# Patient Record
Sex: Male | Born: 1965 | Race: White | Hispanic: No | Marital: Single | Smoking: Never smoker
Health system: Southern US, Community
[De-identification: ages and names within clinical notes are randomized; demographics above are authoritative.]

## PROBLEM LIST (undated history)

## (undated) DIAGNOSIS — M25562 Pain in left knee: Principal | ICD-10-CM

## (undated) DIAGNOSIS — K625 Hemorrhage of anus and rectum: Secondary | ICD-10-CM

## (undated) DIAGNOSIS — I10 Essential (primary) hypertension: Secondary | ICD-10-CM

## (undated) DIAGNOSIS — R7611 Nonspecific reaction to tuberculin skin test without active tuberculosis: Secondary | ICD-10-CM

## (undated) DIAGNOSIS — E559 Vitamin D deficiency, unspecified: Secondary | ICD-10-CM

## (undated) DIAGNOSIS — G8929 Other chronic pain: Secondary | ICD-10-CM

## (undated) DIAGNOSIS — K219 Gastro-esophageal reflux disease without esophagitis: Secondary | ICD-10-CM

## (undated) DIAGNOSIS — K2990 Gastroduodenitis, unspecified, without bleeding: Secondary | ICD-10-CM

## (undated) DIAGNOSIS — Z86010 Personal history of colonic polyps: Secondary | ICD-10-CM

## (undated) HISTORY — DX: Essential (primary) hypertension: I10

## (undated) HISTORY — DX: Pain in left knee: M25.562

## (undated) HISTORY — DX: Gastro-esophageal reflux disease without esophagitis: K21.9

## (undated) HISTORY — DX: Personal history of colonic polyps: Z86.010

## (undated) HISTORY — DX: Vitamin D deficiency, unspecified: E55.9

## (undated) HISTORY — DX: Gastroduodenitis, unspecified, without bleeding: K29.90

## (undated) HISTORY — DX: Other chronic pain: G89.29

## (undated) HISTORY — DX: Hemorrhage of anus and rectum: K62.5

## (undated) HISTORY — DX: Nonspecific reaction to tuberculin skin test without active tuberculosis: R76.11

## (undated) HISTORY — PX: TONSILLECTOMY: SHX5217

---

## 2014-04-29 DIAGNOSIS — Z86010 Personal history of colonic polyps: Secondary | ICD-10-CM

## 2014-04-29 HISTORY — PX: COLONOSCOPY W/ POLYPECTOMY: SHX1380

## 2014-04-29 HISTORY — PX: ESOPHAGOGASTRODUODENOSCOPY: SHX1529

## 2014-04-29 HISTORY — DX: Personal history of colonic polyps: Z86.010

## 2015-05-25 ENCOUNTER — Encounter: Payer: Self-pay | Admitting: Family Medicine

## 2015-05-26 ENCOUNTER — Encounter: Payer: Self-pay | Admitting: Family Medicine

## 2015-05-26 ENCOUNTER — Ambulatory Visit (INDEPENDENT_AMBULATORY_CARE_PROVIDER_SITE_OTHER): Payer: BLUE CROSS/BLUE SHIELD | Admitting: Family Medicine

## 2015-05-26 VITALS — BP 124/89 | HR 79 | Temp 97.5°F | Resp 16 | Ht 72.0 in | Wt 249.5 lb

## 2015-05-26 DIAGNOSIS — R0789 Other chest pain: Secondary | ICD-10-CM

## 2015-05-26 DIAGNOSIS — R109 Unspecified abdominal pain: Secondary | ICD-10-CM | POA: Diagnosis not present

## 2015-05-26 DIAGNOSIS — R0781 Pleurodynia: Secondary | ICD-10-CM | POA: Diagnosis not present

## 2015-05-26 DIAGNOSIS — R140 Abdominal distension (gaseous): Secondary | ICD-10-CM | POA: Diagnosis not present

## 2015-05-26 LAB — COMPREHENSIVE METABOLIC PANEL
ALBUMIN: 4.1 g/dL (ref 3.6–5.1)
ALT: 12 U/L (ref 9–46)
AST: 15 U/L (ref 10–40)
Alkaline Phosphatase: 45 U/L (ref 40–115)
BUN: 14 mg/dL (ref 7–25)
CALCIUM: 9.4 mg/dL (ref 8.6–10.3)
CHLORIDE: 103 mmol/L (ref 98–110)
CO2: 25 mmol/L (ref 20–31)
CREATININE: 1.04 mg/dL (ref 0.60–1.35)
Glucose, Bld: 86 mg/dL (ref 65–99)
POTASSIUM: 4.3 mmol/L (ref 3.5–5.3)
Sodium: 140 mmol/L (ref 135–146)
TOTAL PROTEIN: 7.5 g/dL (ref 6.1–8.1)
Total Bilirubin: 0.7 mg/dL (ref 0.2–1.2)

## 2015-05-26 LAB — CBC WITH DIFFERENTIAL/PLATELET
Basophils Absolute: 0.1 10*3/uL (ref 0.0–0.1)
Basophils Relative: 1 % (ref 0–1)
Eosinophils Absolute: 0.1 10*3/uL (ref 0.0–0.7)
Eosinophils Relative: 2 % (ref 0–5)
HEMATOCRIT: 45.9 % (ref 39.0–52.0)
HEMOGLOBIN: 15.5 g/dL (ref 13.0–17.0)
LYMPHS ABS: 2.8 10*3/uL (ref 0.7–4.0)
Lymphocytes Relative: 44 % (ref 12–46)
MCH: 28.7 pg (ref 26.0–34.0)
MCHC: 33.8 g/dL (ref 30.0–36.0)
MCV: 84.8 fL (ref 78.0–100.0)
MONO ABS: 0.4 10*3/uL (ref 0.1–1.0)
MONOS PCT: 6 % (ref 3–12)
MPV: 11.3 fL (ref 8.6–12.4)
NEUTROS ABS: 3 10*3/uL (ref 1.7–7.7)
NEUTROS PCT: 47 % (ref 43–77)
Platelets: 248 10*3/uL (ref 150–400)
RBC: 5.41 MIL/uL (ref 4.22–5.81)
RDW: 13.7 % (ref 11.5–15.5)
WBC: 6.4 10*3/uL (ref 4.0–10.5)

## 2015-05-26 NOTE — Addendum Note (Signed)
Addended by: Doree BarthelLOWE, CASANDRA on: 05/26/2015 03:14 PM   Modules accepted: Orders

## 2015-05-26 NOTE — Progress Notes (Signed)
Pre visit review using our clinic review tool, if applicable. No additional management support is needed unless otherwise documented below in the visit note. 

## 2015-05-26 NOTE — Progress Notes (Signed)
Office Note 05/26/2015  CC:  Chief Complaint  Patient presents with  . Establish Care    Pt is not fasting.   . Rib Pain    right side x 2-3 weeks, off and on    HPI:  Clinton Garcia is a 50 y.o. White male who is here to establish care and discuss pain on right side lately. Patient's most recent primary MD: [place]. Old records were reviewed prior to or during today's visit.  Has had about 3 wks or so of pain in R/lateral ribs area, feels like a pulled muscle, was the most sore after getting off flight from [place].  Rolling over during sleep aggravated it.  It has improved a little in the last week. Recalls no specific strain or incident in which he made his side hurt. Not made worse by deep breath.  No cough.  No SOB.  Initially eating made him feel bloated, but not now b/c he has changed his diet significantly last week or two.  No fevers.  The pain gets a little better if he has a BM.  Nothing noted on the skin over the area of pain.  Past Medical History  Diagnosis Date  . History of colon polyps 04/29/14    anal polyp x 1  . Positive PPD     CXR normal.  INH x 9 mo in 1980s.  . BRBPR (bright red blood per rectum)     anal fissure  . GERD (gastroesophageal reflux disease)   . Gastroduodenitis     2016 EGD    Past Surgical History  Procedure Laterality Date  . Colonoscopy w/ polypectomy  04/29/14    0.4 cm anal polyp  . Esophagogastroduodenoscopy  04/29/14    GERD, gastro-duodenitis.  Duodenal biopsy:   . Tonsillectomy  1970s    Family History  Problem Relation Age of Onset  . Pancreatic cancer Father   . Heart attack Maternal Aunt 65  . Heart attack Maternal Grandfather   . Diabetes Neg Hx   . Stroke Neg Hx     Social History   Social History  . Marital Status: Single    Spouse Name: N/A  . Number of Children: N/A  . Years of Education: N/A   Occupational History  . Not on file.   Social History Main Topics  . Smoking status: Never Smoker    . Smokeless tobacco: Never Used  . Alcohol Use: Yes     Comment: occaisonally   . Drug Use: No  . Sexual Activity: Not on file   Other Topics Concern  . Not on file   Social History Narrative   Single, no children.   Educ: B.S.   Lives in [place], grew up in [place].   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   No tobacco.  Alcohol: occasionally splurges when on vacation.      MEDS: none  No Known Allergies  ROS Review of Systems  Constitutional: Negative for fever and fatigue.  HENT: Negative for congestion and sore throat.   Eyes: Negative for visual disturbance.  Respiratory: Negative for cough.   Cardiovascular: Negative for chest pain.  Gastrointestinal: Negative for nausea and abdominal pain.  Genitourinary: Negative for dysuria.  Musculoskeletal: Positive for back pain (see HPI). Negative for joint swelling.  Skin: Negative for rash.  Neurological: Positive for tremors. Negative for weakness and headaches.  Hematological: Negative for adenopathy.    PE;  Blood pressure 124/89, pulse 79, temperature 97.5 F (36.4 C), temperature source Oral, resp. rate 16, height 6' (1.829 m), weight 249 lb 8 oz (113.172 kg), SpO2 96 %. Gen: Alert, well appearing.  Patient is oriented to person, place, time, and situation. ZOX:WRUEENT:Eyes: no injection, icteris, swelling, or exudate.  EOMI, PERRLA. Mouth: lips without lesion/swelling.  Oral mucosa pink and moist. Oropharynx without erythema, exudate, or swelling.  Neck - No masses or thyromegaly or limitation in range of motion CV: RRR, no m/r/g.   LUNGS: CTA bilat, nonlabored resps, good aeration in all lung fields. ABD: soft, nondistended, NT, BS normal, no HSM or bruit or mass. EXT: no clubbing, cyanosis, or edema.  SKIN: no rash No TTP over R lateral ribs region and R side of abdomen where pt states he feels his pain. No TTP over R infrascapular area either.  Pertinent labs:  None  today  ASSESSMENT AND PLAN:   New pt; old records reviewed.  Right sided infrascapular/rib/abd pain, with hx of postprandial bloating. He certainly could have musculoskeletal pain but I would expect tenderness to palpation with this. Will pursue r/o of hepatobiliary dz with CBC, CMET, and abd ultrasound. Pt agreeable to this plan.  An After Visit Summary was printed and given to the patient.  Spent 30 min with pt today, with >50% of this time spent in counseling and care coordination regarding the above problems.  Return if symptoms worsen or fail to improve.

## 2015-06-02 ENCOUNTER — Ambulatory Visit
Admission: RE | Admit: 2015-06-02 | Discharge: 2015-06-02 | Disposition: A | Discharge status: Home / Self Care | Payer: BLUE CROSS/BLUE SHIELD | Source: Ambulatory Visit | Attending: Family Medicine | Admitting: Family Medicine

## 2015-06-02 DIAGNOSIS — R140 Abdominal distension (gaseous): Secondary | ICD-10-CM

## 2015-06-02 DIAGNOSIS — R0781 Pleurodynia: Secondary | ICD-10-CM

## 2015-06-02 DIAGNOSIS — R0789 Other chest pain: Secondary | ICD-10-CM

## 2015-06-02 DIAGNOSIS — R109 Unspecified abdominal pain: Secondary | ICD-10-CM

## 2016-11-22 ENCOUNTER — Encounter: Payer: BLUE CROSS/BLUE SHIELD | Admitting: Family Medicine

## 2017-02-05 ENCOUNTER — Ambulatory Visit (INDEPENDENT_AMBULATORY_CARE_PROVIDER_SITE_OTHER): Payer: BLUE CROSS/BLUE SHIELD

## 2017-02-05 DIAGNOSIS — Z23 Encounter for immunization: Secondary | ICD-10-CM

## 2017-02-10 ENCOUNTER — Encounter: Payer: Self-pay | Admitting: Family Medicine

## 2017-02-10 ENCOUNTER — Ambulatory Visit: Payer: BLUE CROSS/BLUE SHIELD | Admitting: Family Medicine

## 2017-02-10 VITALS — BP 144/88 | HR 88 | Temp 98.1°F | Resp 16 | Ht 72.0 in | Wt 268.2 lb

## 2017-02-10 DIAGNOSIS — M25562 Pain in left knee: Secondary | ICD-10-CM

## 2017-02-10 DIAGNOSIS — G8929 Other chronic pain: Secondary | ICD-10-CM

## 2017-02-10 DIAGNOSIS — M222X2 Patellofemoral disorders, left knee: Secondary | ICD-10-CM | POA: Diagnosis not present
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
## 2017-02-11 ENCOUNTER — Ambulatory Visit (HOSPITAL_COMMUNITY)
Admission: RE | Admit: 2017-02-11 | Discharge: 2017-02-11 | Disposition: A | Discharge status: Home / Self Care | Payer: BLUE CROSS/BLUE SHIELD | Source: Ambulatory Visit | Attending: Family Medicine | Admitting: Family Medicine

## 2017-02-11 DIAGNOSIS — M25562 Pain in left knee: Secondary | ICD-10-CM | POA: Diagnosis present

## 2017-02-11 DIAGNOSIS — G8929 Other chronic pain: Secondary | ICD-10-CM | POA: Diagnosis not present

## 2017-02-11 DIAGNOSIS — M1712 Unilateral primary osteoarthritis, left knee: Secondary | ICD-10-CM | POA: Insufficient documentation

## 2017-08-14 ENCOUNTER — Encounter: Payer: Self-pay | Admitting: Family Medicine

## 2017-11-13 ENCOUNTER — Ambulatory Visit (INDEPENDENT_AMBULATORY_CARE_PROVIDER_SITE_OTHER): Payer: BLUE CROSS/BLUE SHIELD

## 2017-11-13 DIAGNOSIS — Z23 Encounter for immunization: Secondary | ICD-10-CM | POA: Diagnosis not present

## 2017-12-16 DIAGNOSIS — I10 Essential (primary) hypertension: Secondary | ICD-10-CM

## 2017-12-16 DIAGNOSIS — E559 Vitamin D deficiency, unspecified: Secondary | ICD-10-CM

## 2017-12-16 HISTORY — DX: Vitamin D deficiency, unspecified: E55.9

## 2017-12-16 HISTORY — DX: Essential (primary) hypertension: I10

## 2017-12-24 LAB — PULMONARY FUNCTION TEST

## 2017-12-25 ENCOUNTER — Encounter: Payer: Self-pay | Admitting: Family Medicine

## 2017-12-25 ENCOUNTER — Ambulatory Visit (INDEPENDENT_AMBULATORY_CARE_PROVIDER_SITE_OTHER): Payer: BLUE CROSS/BLUE SHIELD | Admitting: Family Medicine

## 2017-12-25 VITALS — BP 138/93 | HR 73 | Temp 98.1°F | Resp 20 | Ht 72.75 in | Wt 281.0 lb

## 2017-12-25 DIAGNOSIS — Z125 Encounter for screening for malignant neoplasm of prostate: Secondary | ICD-10-CM | POA: Diagnosis not present

## 2017-12-25 DIAGNOSIS — Z0000 Encounter for general adult medical examination without abnormal findings: Secondary | ICD-10-CM

## 2017-12-25 DIAGNOSIS — I10 Essential (primary) hypertension: Secondary | ICD-10-CM | POA: Diagnosis not present

## 2017-12-25 DIAGNOSIS — E559 Vitamin D deficiency, unspecified: Secondary | ICD-10-CM

## 2017-12-25 NOTE — Progress Notes (Addendum)
Office Note 01/04/2018  CC:  Chief Complaint  Patient presents with  . Annual Exam    HPI:  Clinton Garcia is a 52 y.o.  male who is here for annual health maintenance exam. Reviewed his recent labs done 12/23/17: CBC w/diff, CMET, FLP, HbA1c, EKG, and PFTs. All normal except vit D level was 20.  Can't exercise b/c of knee injury a couple years ago.  His wt has gone up 30 lbs in the last 2 yrs.  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  Past Medical History:  Diagnosis Date  . BRBPR (bright red blood per rectum)    anal fissure  . Chronic pain of left knee   . Gastroduodenitis    2016 EGD  . GERD (gastroesophageal reflux disease)   . History of colon polyps 04/29/14   anal polyp x 1  . Positive PPD    CXR normal.  INH x 9 mo in 1980s.    Past Surgical History:  Procedure Laterality Date  . COLONOSCOPY W/ POLYPECTOMY  04/29/14   0.4 cm anal polyp  . ESOPHAGOGASTRODUODENOSCOPY  04/29/14   GERD, gastro-duodenitis.  Duodenal biopsy:   . TONSILLECTOMY  1970s    Family History  Problem Relation Age of Onset  . Pancreatic cancer Father   . Heart attack Maternal Aunt 65  . Heart attack Maternal Grandfather   . Diabetes Neg Hx   . Stroke Neg Hx     Social History   Socioeconomic History  . Marital status: Single    Spouse name: Not on file  . Number of children: Not on file  . Years of education: Not on file  . Highest education level: Not on file  Occupational History  . Not on file  Social Needs  . Financial resource strain: Not on file  . Food insecurity:    Worry: Not on file    Inability: Not on file  . Transportation needs:    Medical: Not on file    Non-medical: Not on file  Tobacco Use  . Smoking status: Never Smoker  . Smokeless tobacco: Never Used  Substance and Sexual Activity  . Alcohol use: Yes    Comment: occaisonally   . Drug use: No  . Sexual activity: Not on file  Lifestyle  . Physical activity:    Days per  week: Not on file    Minutes per session: Not on file  . Stress: Not on file  Relationships  . Social connections:    Talks on phone: Not on file    Gets together: Not on file    Attends religious service: Not on file    Active member of club or organization: Not on file    Attends meetings of clubs or organizations: Not on file    Relationship status: Not on file  . Intimate partner violence:    Fear of current or ex partner: Not on file    Emotionally abused: Not on file    Physically abused: Not on file    Forced sexual activity: Not on file  Other Topics Concern  . Not on file  Social History Narrative   Single, no children.   Educ: B.S.   Lives in [place], grew up in [place].   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   No tobacco.  Alcohol: occasionally splurges when on vacation.   MEDS: none  No Known Allergies  ROS Review of Systems  Constitutional: Negative for appetite change, chills, fatigue and fever.  HENT: Negative for congestion, dental problem, ear pain and sore throat.   Eyes: Negative for discharge, redness and visual disturbance.  Respiratory: Negative for cough, chest tightness, shortness of breath and wheezing.   Cardiovascular: Negative for chest pain, palpitations and leg swelling.  Gastrointestinal: Negative for abdominal pain, blood in stool, diarrhea, nausea and vomiting.  Genitourinary: Negative for difficulty urinating, dysuria, flank pain, frequency, hematuria and urgency.  Musculoskeletal: Negative for arthralgias, back pain, joint swelling, myalgias and neck stiffness.  Skin: Negative for pallor and rash.  Neurological: Negative for dizziness, speech difficulty, weakness and headaches.  Hematological: Negative for adenopathy. Does not bruise/bleed easily.  Psychiatric/Behavioral: Negative for confusion and sleep disturbance. The patient is not nervous/anxious.     PE;Initial bp here today was  141/84, HR 98 Blood pressure (!) 138/93, pulse 73, temperature 98.1 F (36.7 C), resp. rate 20, height 6' 0.75" (1.848 m), weight 281 lb (127.5 kg), SpO2 98 %. Body mass index is 37.33 kg/m.  Gen: Alert, well appearing.  Patient is oriented to person, place, time, and situation. AFFECT: pleasant, lucid thought and speech. ENT: Ears: EACs clear, normal epithelium.  TMs with good light reflex and landmarks bilaterally.  Eyes: no injection, icteris, swelling, or exudate.  EOMI, PERRLA. Nose: no drainage or turbinate edema/swelling.  No injection or focal lesion.  Mouth: lips without lesion/swelling.  Oral mucosa pink and moist.  Dentition intact and without obvious caries or gingival swelling.  Oropharynx without erythema, exudate, or swelling.  Neck: supple/nontender.  No LAD, mass, or TM.  Carotid pulses 2+ bilaterally, without bruits. CV: RRR, no m/r/g.   LUNGS: CTA bilat, nonlabored resps, good aeration in all lung fields. ABD: soft, NT, ND, BS normal.  No hepatospenomegaly or mass.  No bruits. EXT: no clubbing, cyanosis, or edema.  Musculoskeletal: no joint swelling, erythema, warmth, or tenderness.  ROM of all joints intact. Skin - no sores or suspicious lesions or rashes or color changes   Pertinent labs:  No results found for: TSH Lab Results  Component Value Date   WBC 6.4 05/26/2015   HGB 15.5 05/26/2015   HCT 45.9 05/26/2015   MCV 84.8 05/26/2015   PLT 248 05/26/2015   Lab Results  Component Value Date   CREATININE 1.04 05/26/2015   BUN 14 05/26/2015   NA 140 05/26/2015   K 4.3 05/26/2015   CL 103 05/26/2015   CO2 25 05/26/2015   Lab Results  Component Value Date   ALT 12 05/26/2015   AST 15 05/26/2015   ALKPHOS 45 05/26/2015   BILITOT 0.7 05/26/2015   No results found for: CHOL No results found for: HDL No results found for: LDLCALC No results found for: TRIG No results found for: CHOLHDL No results found for: PSA  No results found for: HGBA1C  ASSESSMENT  AND PLAN:   1) Ess hypertention: pt declined to start antihypertensive at this time.  We reviewed need for more aggressive dietary changes, cv exercise efforts, and gradual wt loss.  DASH diet reviewed, handout given. 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  2) Vit D deficiency: he gets very little sunlight.  I recommended he get more sunlight and start 5000 U vit D tab once a day.  3) Health maintenance exam: Reviewed age and gender appropriate health maintenance issues (prudent diet, regular exercise,  health risks of tobacco and excessive alcohol, use of seatbelts, fire alarms in home, use of sunscreen).  Also reviewed age and gender appropriate health screening as well as vaccine recommendations. Vaccines: flu vaccine-->has already had this.   Shingrix -->declined for now. Labs: all labs recently done 2 d/a-->normal except low vit D.  Recommended 5000 U otc vit D tab daily. Prostate ca screening: he deferred prostate cancer screening today. Colon ca screening: next colonoscopy due 2026.   An After Visit Summary was printed and given to the patient.  FOLLOW UP:  Return in about 1 year (around 12/26/2018) for annual CPE (fasting).  Signed:  Santiago Bumpers, MD           01/04/2018

## 2017-12-25 NOTE — Patient Instructions (Addendum)
Buy over the counter vit D3 5000 Unit tabs, take one every day.   DASH Eating Plan DASH stands for "Dietary Approaches to Stop Hypertension." The DASH eating plan is a healthy eating plan that has been shown to reduce high blood pressure (hypertension). It may also reduce your risk for type 2 diabetes, heart disease, and stroke. The DASH eating plan may also help with weight loss. What are tips for following this plan? General guidelines  Avoid eating more than 2,300 mg (milligrams) of salt (sodium) a day. If you have hypertension, you may need to reduce your sodium intake to 1,500 mg a day.  Limit alcohol intake to no more than 1 drink a day for nonpregnant women and 2 drinks a day for men. One drink equals 12 oz of beer, 5 oz of wine, or 1 oz of hard liquor.  Work with your health care provider to maintain a healthy body weight or to lose weight. Ask what an ideal weight is for you.  Get at least 30 minutes of exercise that causes your heart to beat faster (aerobic exercise) most days of the week. Activities may include walking, swimming, or biking.  Work with your health care provider or diet and nutrition specialist (dietitian) to adjust your eating plan to your individual calorie needs. Reading food labels  Check food labels for the amount of sodium per serving. Choose foods with less than 5 percent of the Daily Value of sodium. Generally, foods with less than 300 mg of sodium per serving fit into this eating plan.  To find whole grains, look for the word "whole" as the first word in the ingredient list. Shopping  Buy products labeled as "low-sodium" or "no salt added."  Buy fresh foods. Avoid canned foods and premade or frozen meals. Cooking  Avoid adding salt when cooking. Use salt-free seasonings or herbs instead of table salt or sea salt. Check with your health care provider or pharmacist before using salt substitutes.  Do not fry foods. Cook foods using healthy methods such  as baking, boiling, grilling, and broiling instead.  Cook with heart-healthy oils, such as olive, canola, soybean, or sunflower oil. Meal planning   Eat a balanced diet that includes: ? 5 or more servings of fruits and vegetables each day. At each meal, try to fill half of your plate with fruits and vegetables. ? Up to 6-8 servings of whole grains each day. ? Less than 6 oz of lean meat, poultry, or fish each day. A 3-oz serving of meat is about the same size as a deck of cards. One egg equals 1 oz. ? 2 servings of low-fat dairy each day. ? A serving of nuts, seeds, or beans 5 times each week. ? Heart-healthy fats. Healthy fats called Omega-3 fatty acids are found in foods such as flaxseeds and coldwater fish, like sardines, salmon, and mackerel.  Limit how much you eat of the following: ? Canned or prepackaged foods. ? Food that is high in trans fat, such as fried foods. ? Food that is high in saturated fat, such as fatty meat. ? Sweets, desserts, sugary drinks, and other foods with added sugar. ? Full-fat dairy products.  Do not salt foods before eating.  Try to eat at least 2 vegetarian meals each week.  Eat more home-cooked food and less restaurant, buffet, and fast food.  When eating at a restaurant, ask that your food be prepared with less salt or no salt, if possible. What foods are recommended?  The items listed may not be a complete list. Talk with your dietitian about what dietary choices are best for you. Grains Whole-grain or whole-wheat bread. Whole-grain or whole-wheat pasta. Brown rice. [place]row. Bulgur. Whole-grain and low-sodium cereals. Pita bread. Low-fat, low-sodium crackers. Whole-wheat flour tortillas. Vegetables Fresh or frozen vegetables (raw, steamed, roasted, or grilled). Low-sodium or reduced-sodium tomato and vegetable juice. Low-sodium or reduced-sodium tomato sauce and tomato paste. Low-sodium or reduced-sodium canned vegetables. Fruits All  fresh, dried, or frozen fruit. Canned fruit in natural juice (without added sugar). Meat and other protein foods Skinless chicken or [place]. Ground chicken or [place]. Pork with fat trimmed off. Fish and seafood. Egg whites. Dried beans, peas, or lentils. Unsalted nuts, nut butters, and seeds. Unsalted canned beans. Lean cuts of beef with fat trimmed off. Low-sodium, lean deli meat. Dairy Low-fat (1%) or fat-free (skim) milk. Fat-free, low-fat, or reduced-fat cheeses. Nonfat, low-sodium ricotta or cottage cheese. Low-fat or nonfat yogurt. Low-fat, low-sodium cheese. Fats and oils Soft margarine without trans fats. Vegetable oil. Low-fat, reduced-fat, or light mayonnaise and salad dressings (reduced-sodium). Canola, safflower, olive, soybean, and sunflower oils. Avocado. Seasoning and other foods Herbs. Spices. Seasoning mixes without salt. Unsalted popcorn and pretzels. Fat-free sweets. What foods are not recommended? The items listed may not be a complete list. Talk with your dietitian about what dietary choices are best for you. Grains Baked goods made with fat, such as croissants, muffins, or some breads. Dry pasta or rice meal packs. Vegetables Creamed or fried vegetables. Vegetables in a cheese sauce. Regular canned vegetables (not low-sodium or reduced-sodium). Regular canned tomato sauce and paste (not low-sodium or reduced-sodium). Regular tomato and vegetable juice (not low-sodium or reduced-sodium). Angie Fava. Olives. Fruits Canned fruit in a light or heavy syrup. Fried fruit. Fruit in cream or butter sauce. Meat and other protein foods Fatty cuts of meat. Ribs. Fried meat. Berniece Salines. Sausage. Bologna and other processed lunch meats. Salami. Fatback. Hotdogs. Bratwurst. Salted nuts and seeds. Canned beans with added salt. Canned or smoked fish. Whole eggs or egg yolks. Chicken or [place] with skin. Dairy Whole or 2% milk, cream, and half-and-half. Whole or full-fat cream cheese. Whole-fat or  sweetened yogurt. Full-fat cheese. Nondairy creamers. Whipped toppings. Processed cheese and cheese spreads. Fats and oils Butter. Stick margarine. Lard. Shortening. Ghee. Bacon fat. Tropical oils, such as coconut, palm kernel, or palm oil. Seasoning and other foods Salted popcorn and pretzels. Onion salt, garlic salt, seasoned salt, table salt, and sea salt. Worcestershire sauce. Tartar sauce. Barbecue sauce. Teriyaki sauce. Soy sauce, including reduced-sodium. Steak sauce. Canned and packaged gravies. Fish sauce. Oyster sauce. Cocktail sauce. Horseradish that you find on the shelf. Ketchup. Mustard. Meat flavorings and tenderizers. Bouillon cubes. Hot sauce and Tabasco sauce. Premade or packaged marinades. Premade or packaged taco seasonings. Relishes. Regular salad dressings. Where to find more information:  National Heart, Lung, and [place]: https://wilson-eaton.com/  American Heart Association: www.heart.org Summary  The DASH eating plan is a healthy eating plan that has been shown to reduce high blood pressure (hypertension). It may also reduce your risk for type 2 diabetes, heart disease, and stroke.  With the DASH eating plan, you should limit salt (sodium) intake to 2,300 mg a day. If you have hypertension, you may need to reduce your sodium intake to 1,500 mg a day.  When on the DASH eating plan, aim to eat more fresh fruits and vegetables, whole grains, lean proteins, low-fat dairy, and heart-healthy fats.  Work with your health care  provider or diet and nutrition specialist (dietitian) to adjust your eating plan to your individual calorie needs. This information is not intended to replace advice given to you by your health care provider. Make sure you discuss any questions you have with your health care provider. Document Released: 02/21/2011 Document Revised: 02/26/2016 Document Reviewed: 02/26/2016 Elsevier Interactive Patient Education  2018 Elsevier Inc.  

## 2018-01-04 ENCOUNTER — Encounter: Payer: Self-pay | Admitting: Family Medicine

## 2018-04-03 IMAGING — DX DG KNEE COMPLETE 4+V*L*
4 series · 4 of 4 positions shown · non-contrast
Comparison: None

CLINICAL DATA: Ongoing chronic LEFT knee pain, occasional
instability, worsened since last year, increased pain with
weight-bearing, some pain around patella, remote injury 4522

EXAM:
LEFT KNEE - COMPLETE 4+ VIEW

[knee ap]
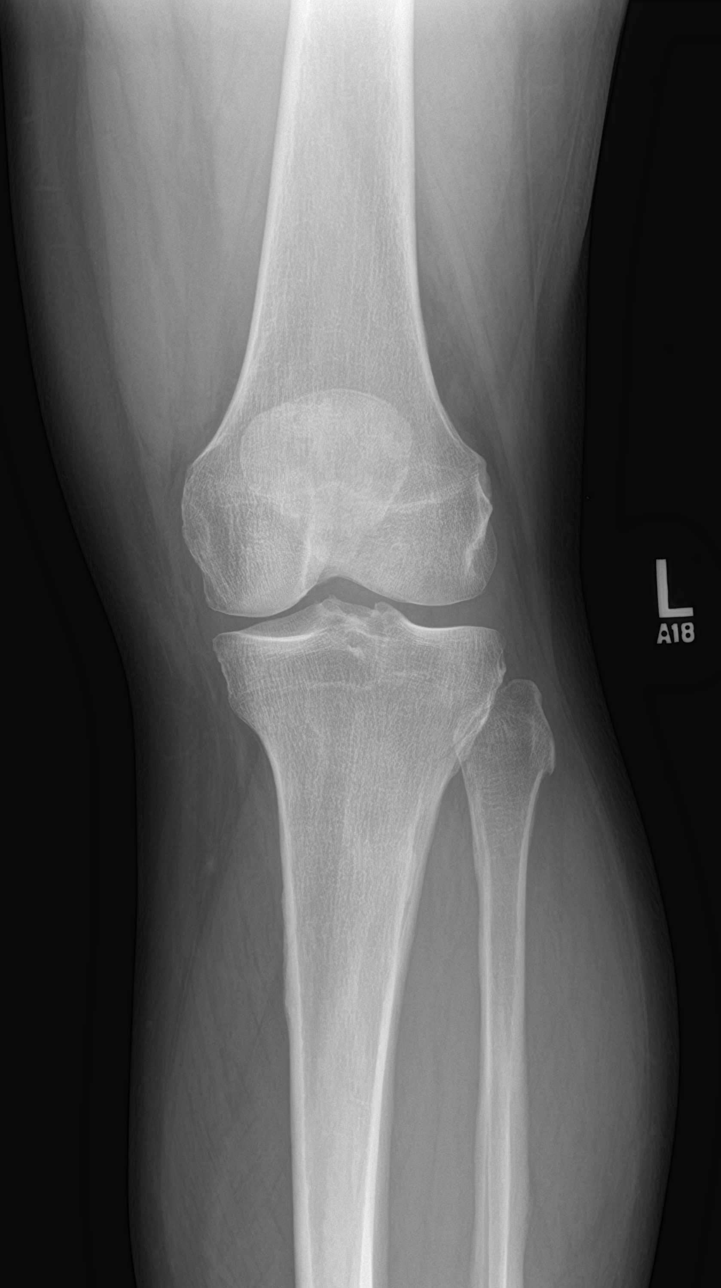

[knee lat]
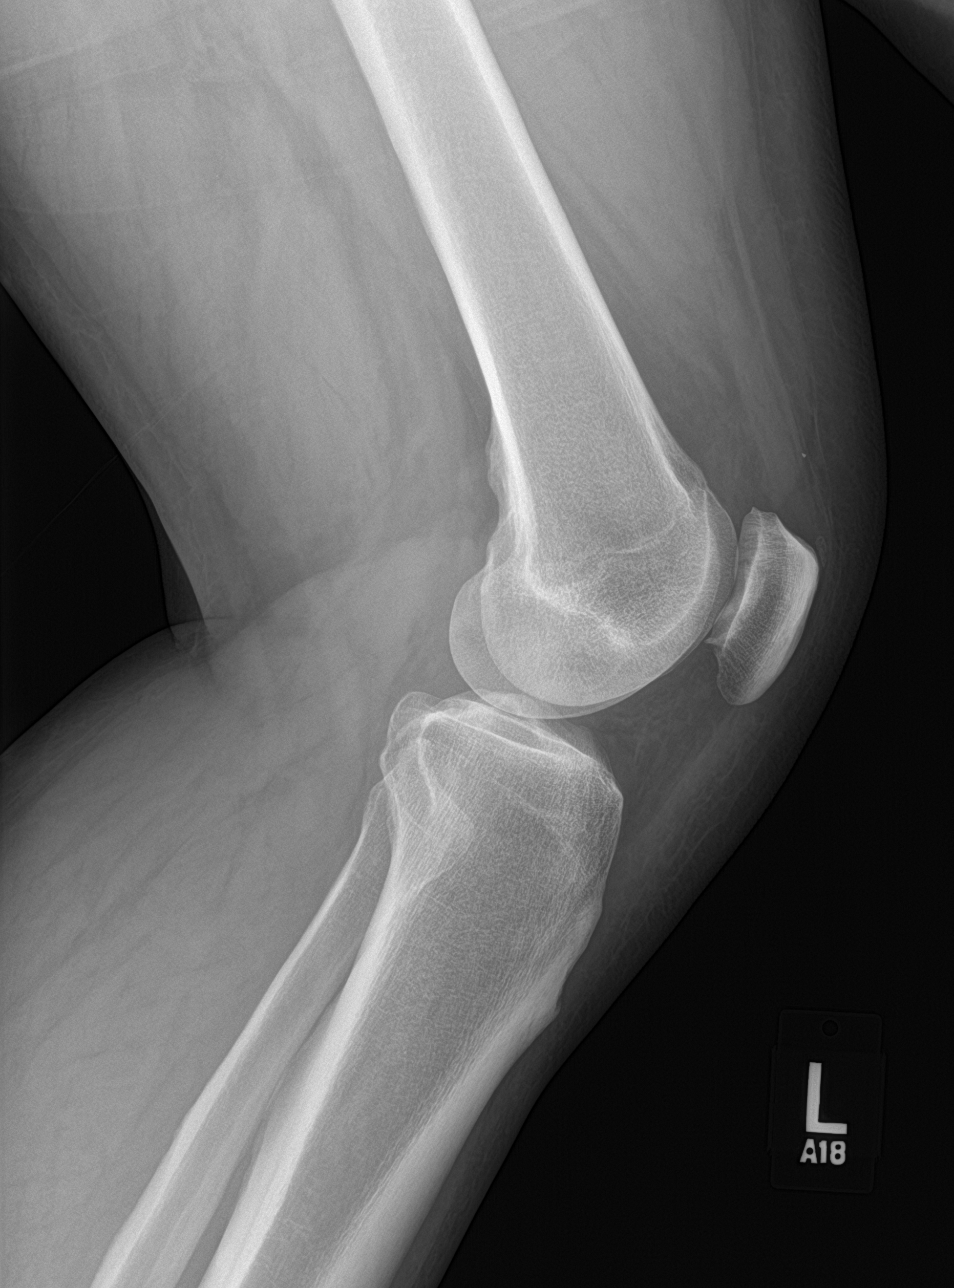

[knee obl (1 of 2)]
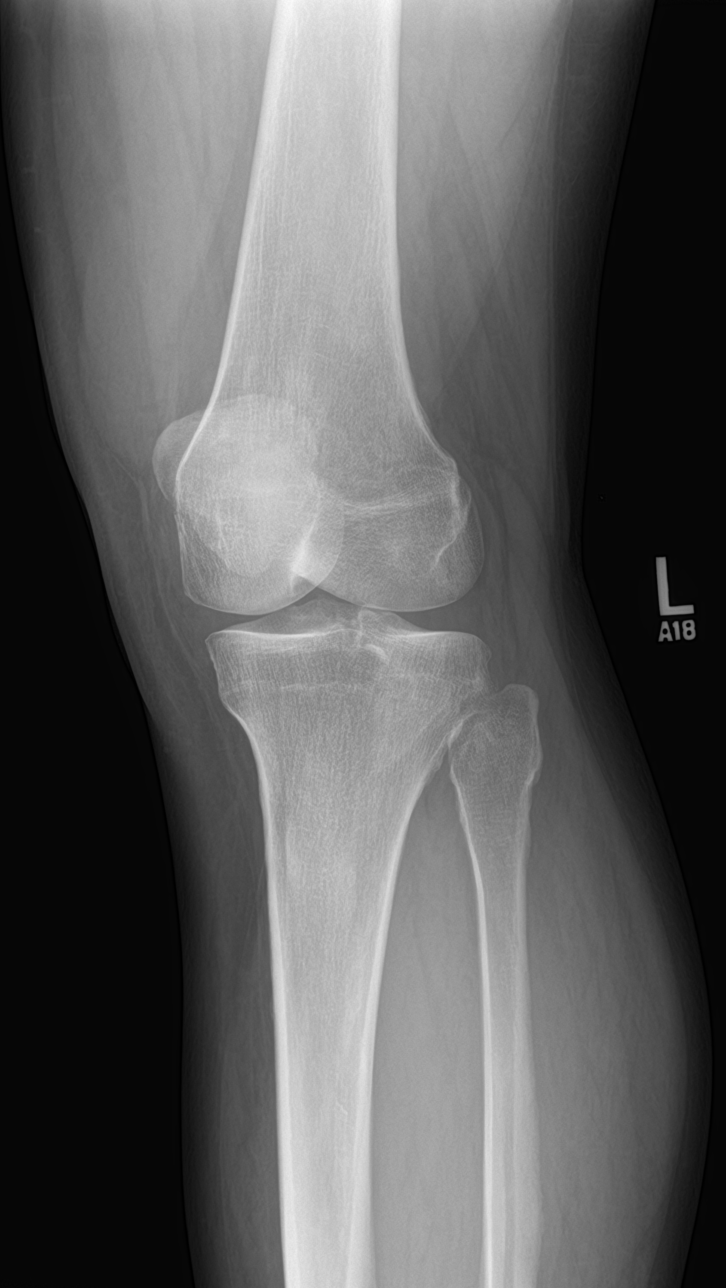

[knee obl (2 of 2)]
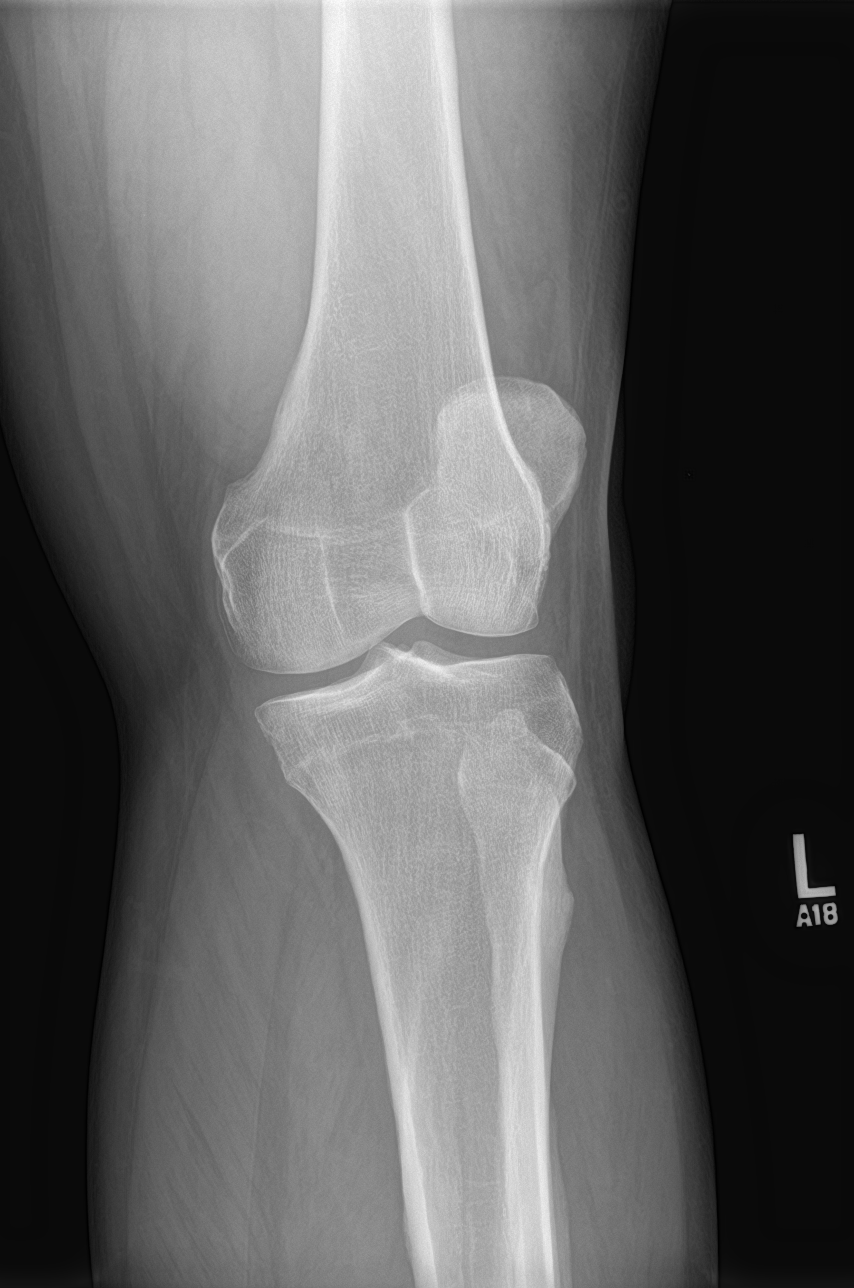

[4 of 4 positions shown; findings below may reference images not displayed]

FINDINGS: Osseous mineralization normal.

Mild patellofemoral joint space narrowing with tiny spur at inferior
articular margin of patella.

Mediolateral compartment joint spaces appear preserved.

No acute fracture, dislocation or bone destruction.

No knee joint effusion.
IMPRESSION: Mild patellofemoral degenerative changes LEFT knee.

## 2018-11-25 ENCOUNTER — Ambulatory Visit (INDEPENDENT_AMBULATORY_CARE_PROVIDER_SITE_OTHER): Payer: BC Managed Care – PPO

## 2018-11-25 ENCOUNTER — Other Ambulatory Visit: Payer: Self-pay

## 2018-11-25 DIAGNOSIS — Z23 Encounter for immunization: Secondary | ICD-10-CM | POA: Diagnosis not present

## 2019-05-28 ENCOUNTER — Ambulatory Visit: Payer: BC Managed Care – PPO | Attending: Internal Medicine

## 2019-05-28 DIAGNOSIS — Z23 Encounter for immunization: Secondary | ICD-10-CM

## 2019-05-28 NOTE — Progress Notes (Signed)
   Covid-19 Vaccination Clinic  Name:  Clinton Garcia    MRN: [id number] DOB: [birth year]  05/28/2019  Clinton Garcia was observed post Covid-19 immunization for 15 minutes without incident. He was provided with Vaccine Information Sheet and instruction to access the V-Safe system.   Clinton Garcia was instructed to call 911 with any severe reactions post vaccine: Marland Kitchen Difficulty breathing  . Swelling of face and throat  . A fast heartbeat  . A bad rash all over body  . Dizziness and weakness   Immunizations Administered    Name Date Dose VIS Date Route   Pfizer COVID-19 Vaccine 05/28/2019 12:28 PM 0.3 mL 02/26/2019 Intramuscular   Manufacturer: [employer], [employer]   Lot: [id number]   NDC: [address]-1

## 2019-06-21 ENCOUNTER — Ambulatory Visit: Payer: BC Managed Care – PPO | Admitting: Family Medicine

## 2019-06-21 ENCOUNTER — Encounter: Payer: Self-pay | Admitting: Family Medicine

## 2019-06-21 ENCOUNTER — Ambulatory Visit: Payer: BC Managed Care – PPO | Attending: Internal Medicine

## 2019-06-21 ENCOUNTER — Other Ambulatory Visit: Payer: Self-pay

## 2019-06-21 VITALS — BP 142/100 | HR 90 | Temp 98.1°F | Resp 16 | Ht 72.75 in | Wt 229.0 lb

## 2019-06-21 DIAGNOSIS — Z23 Encounter for immunization: Secondary | ICD-10-CM

## 2019-06-21 DIAGNOSIS — I10 Essential (primary) hypertension: Secondary | ICD-10-CM | POA: Diagnosis not present

## 2019-06-21 LAB — BASIC METABOLIC PANEL
BUN: 13 mg/dL (ref 6–23)
CO2: 29 mEq/L (ref 19–32)
Calcium: 9.3 mg/dL (ref 8.4–10.5)
Chloride: 102 mEq/L (ref 96–112)
Creatinine, Ser: 0.98 mg/dL (ref 0.40–1.50)
GFR: 79.92 mL/min (ref >60.00)
Glucose, Bld: 91 mg/dL (ref 70–99)
Potassium: 4.6 mEq/L (ref 3.5–5.1)
Sodium: 139 mEq/L (ref 135–145)

## 2019-06-21 MED ORDER — IRBESARTAN 150 MG PO TABS: 150.0000 mg | ORAL_TABLET | Freq: Every day | ORAL | 0 refills | Status: AC

## 2019-06-21 NOTE — Progress Notes (Signed)
   Covid-19 Vaccination Clinic  Name:  Clinton Garcia    MRN: [id number] DOB: 02-18-1966  06/21/2019  [name] was observed post Covid-19 immunization for 15 minutes without incident. He was provided with Vaccine Information Sheet and instruction to access the V-Safe system.   [name] was instructed to call 911 with any severe reactions post vaccine: Marland Kitchen Difficulty breathing  . Swelling of face and throat  . A fast heartbeat  . A bad rash all over body  . Dizziness and weakness   Immunizations Administered    Name Date Dose VIS Date Route   Pfizer COVID-19 Vaccine 06/21/2019  1:15 PM 0.3 mL 02/26/2019 Intramuscular   Manufacturer: [employer], [employer]   Lot: [id number]   NDC: [address]-0

## 2019-06-21 NOTE — Progress Notes (Signed)
See med student note above. Signed:  Phil Sydne Krahl, MD           06/21/2019  

## 2019-06-21 NOTE — Progress Notes (Signed)
CC: F/u  HTN  HPI: Pt here today for f/u of elevated bp's at the "mandate" of his employer. Was told he needs 3 bp's in my office <140/90 or he would not be allowed to return to work.  A/P from last visit in October 2019:   1) Ess hypertention: pt declined to start antihypertensive at this time.  We reviewed need for more aggressive dietary changes, cv exercise efforts, and gradual wt loss.  DASH diet reviewed, handout given. 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  2) Vit D deficiency: he gets very little sunlight.  I recommended he get more sunlight and start 5000 U vit D tab once a day.  3) Health maintenance exam: Reviewed age and gender appropriate health maintenance issues (prudent diet, regular exercise, health risks of tobacco and excessive alcohol, use of seatbelts, fire alarms in home, use of sunscreen).  Also reviewed age and gender appropriate health screening as well as vaccine recommendations. Vaccines: flu vaccine-->has already had this.   Shingrix -->declined for now. Labs: all labs recently done 2 d/a-->normal except low vit D.  Recommended 5000 U otc vit D tab daily. Prostate ca screening: he deferred prostate cancer screening today. Colon ca screening: next colonoscopy due 2026.  HPI: feeling well, no complaints. 148/96 in office today  Not happy about "doing drugs" so wants to control BP without meds as "big pharma is a rabbit hole"  Every year has to do physical  Has been checking BP at home  -123/79 this morning  -Usually in the 130s on the top number  -Usually in the 80s on bottom number  Seems to think that mood influences a lot  Has lost over 50 pounds  Not big salt person  Diet pretty healthy - eats broccoli and fish (salmon w olive oil) and salad bar on ship Caffeine - lots of coffee but past week has cut out since getting email about high BP  Exercise - walking every day 8000 steps   No joint pain or bone pain      PMH: Past Medical History:  Diagnosis Date  . BRBPR (bright red blood per rectum)    anal fissure  . Chronic pain of left knee   . Essential hypertension 12/2017   pt declined med  . Gastroduodenitis    2016 EGD  . GERD (gastroesophageal reflux disease)   . History of colon polyps 04/29/14   anal polyp x 1  . Positive PPD    CXR normal.  INH x 9 mo in 1980s.  . Vitamin D deficiency 12/2017    M/A: No current outpatient medications on file prior to visit.   No current facility-administered medications on file prior to visit.   No Known Allergies  Seasonal allergies but no drugs for it   FH: Family History  Problem Relation Age of Onset  . Pancreatic cancer Father   . Heart attack Maternal Aunt 65  . Heart attack Maternal Grandfather   . Diabetes Neg Hx   . Stroke Neg Hx     SH: Social History   Socioeconomic History  . Marital status: Single    Spouse name: Not on file  . Number of children: Not on file  . Years of education: Not on file  . Highest education level: Not on file  Occupational History  . Not on file  Tobacco Use  . Smoking status: Never Smoker  .  Smokeless tobacco: Never Used  Substance and Sexual Activity  . Alcohol use: Yes    Comment: occaisonally   . Drug use: No  . Sexual activity: Not on file  Other Topics Concern  . Not on file  Social History Narrative   Single, no children.   Educ: B.S.   Lives in [place], grew up in [place].   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  Works 75d on and 75d off.   No tobacco.  Alcohol: occasionally splurges when on vacation.   Social Determinants of Health   Financial Resource Strain:   . Difficulty of Paying Living Expenses:   Food Insecurity:   . Worried About Charity fundraiser in the Last Year:   . Arboriculturist in the Last Year:   Transportation Needs:   . Film/video editor (Medical):   Marland Kitchen Lack of Transportation  (Non-Medical):   Physical Activity:   . Days of Exercise per Week:   . Minutes of Exercise per Session:   Stress:   . Feeling of Stress :   Social Connections:   . Frequency of Communication with Friends and Family:   . Frequency of Social Gatherings with Friends and Family:   . Attends Religious Services:   . Active Member of Clubs or Organizations:   . Attends Archivist Meetings:   Marland Kitchen Marital Status:     ROS: Review of Systems  Constitutional: Negative.  Negative for chills, diaphoresis, fever, malaise/fatigue and weight loss.  HENT: Negative.  Negative for congestion, ear discharge, ear pain, hearing loss, nosebleeds, sinus pain, sore throat and tinnitus.   Eyes: Negative.   Respiratory: Negative.  Negative for cough, hemoptysis, sputum production, shortness of breath, wheezing and stridor.   Cardiovascular: Negative.  Negative for chest pain, palpitations, orthopnea, claudication, leg swelling and PND.  Gastrointestinal: Negative.  Negative for abdominal pain, blood in stool, constipation, diarrhea, heartburn, nausea and vomiting.  Genitourinary: Negative.   Musculoskeletal: Negative.   Skin: Negative.  Negative for itching and rash.  Neurological: Negative.  Negative for dizziness, tingling, tremors, seizures, loss of consciousness and headaches.  Endo/Heme/Allergies: Negative.   Psychiatric/Behavioral: Negative for depression, hallucinations, memory loss, substance abuse and suicidal ideas. The patient is not nervous/anxious and does not have insomnia.     PE: Vitals with BMI 12/25/2017 02/10/2017 05/26/2015  Height 6' 0.75" 6\' 0"  6\' 0"   Weight 281 lbs 268 lbs 4 oz 249 lbs 8 oz  BMI 37.32 17.40 81.4  Systolic 481 856 314  Diastolic 93 88 89  Pulse 73 88 79   BP recheck manually today 142/100   Physical Exam  Constitutional: He is oriented to person, place, and time and well-developed, well-nourished, and in no distress. No distress.  HENT:  Head:  Normocephalic and atraumatic.  Right Ear: External ear normal.  Left Ear: External ear normal.  Nose: Nose normal.  Mouth/Throat: Oropharynx is clear and moist. No oropharyngeal exudate.  Eyes: Pupils are equal, round, and reactive to light. Conjunctivae and EOM are normal. Right eye exhibits no discharge. Left eye exhibits no discharge. No scleral icterus.  Neck: No JVD present. No tracheal deviation present. No thyromegaly present.  Cardiovascular: Normal rate, regular rhythm, normal heart sounds and intact distal pulses. Exam reveals no gallop and no friction rub.  No murmur heard. Pulmonary/Chest: Effort normal and breath sounds normal. No stridor. No respiratory distress. He has no wheezes. He has no rales. He exhibits no tenderness.  Musculoskeletal:        General: No tenderness, deformity or edema. Normal range of motion.     Cervical back: Normal range of motion and neck supple.  Lymphadenopathy:    He has no cervical adenopathy.  Neurological: He is alert and oriented to person, place, and time.  Skin: Skin is warm and dry. He is not diaphoretic.  Psychiatric: Mood, memory, affect and judgment normal.    A/P: In summary, Clinton Garcia is a 54 year old man with a past medical history of essential hypertension and vitamin D deficiency who presents for a f/u on his HTN for work clearance. On physical exam, his BP was elevated at 142/100 but otherwise unremarkable. We talked about the the long term consequences of high blood pressure and the importance of keeping it under control. We reviewed age and gender appropriate health maintenance issues (prudent diet, regular exercise, health risks of tobacco and excessive alcohol, use of seatbelts, fire alarms in home, use of sunscreen). My plan is to start irbesartan 150 mg once a day for HTN and check BMP to assess renal function and follow up in 2 weeks for office visit HTN recheck. Vaccines: Tdap UTD. Colon cancer screening: had  initial screening colonoscopy 04/29/2014 which was unremarkable so due in 10 yrs Labs: BMP today  Follow up: April 20 for new HTN med   Signed: Abigail Miyamoto, MS3  21 June 2019   ADDENDUM: I personally was present during the history, physical exam, and medical decision-making activities of this service and have verified that the service and findings are accurately documented in the student's note. Signed:  Santiago Bumpers, MD           06/21/2019

## 2019-06-23 ENCOUNTER — Ambulatory Visit: Payer: BC Managed Care – PPO | Attending: Internal Medicine

## 2019-06-23 DIAGNOSIS — Z20822 Contact with and (suspected) exposure to covid-19: Secondary | ICD-10-CM

## 2019-06-25 LAB — NOVEL CORONAVIRUS, NAA: SARS-CoV-2, NAA: NOT DETECTED

## 2019-06-25 LAB — SARS-COV-2, NAA 2 DAY TAT

## 2019-07-06 ENCOUNTER — Ambulatory Visit: Payer: BC Managed Care – PPO | Admitting: Family Medicine

## 2019-07-13 ENCOUNTER — Ambulatory Visit: Payer: BC Managed Care – PPO | Admitting: Family Medicine

## 2019-08-26 ENCOUNTER — Other Ambulatory Visit: Payer: BC Managed Care – PPO

## 2020-08-03 ENCOUNTER — Ambulatory Visit: Payer: BC Managed Care – PPO | Attending: Internal Medicine

## 2020-08-03 ENCOUNTER — Other Ambulatory Visit (HOSPITAL_BASED_OUTPATIENT_CLINIC_OR_DEPARTMENT_OTHER): Payer: Self-pay

## 2020-08-03 ENCOUNTER — Other Ambulatory Visit: Payer: Self-pay

## 2020-08-03 DIAGNOSIS — Z23 Encounter for immunization: Secondary | ICD-10-CM

## 2020-08-03 MED ORDER — PFIZER-BIONT COVID-19 VAC-TRIS 30 MCG/0.3ML IM SUSP
INTRAMUSCULAR | 0 refills | Status: AC
  Filled 2020-08-03: qty 0.3, 1d supply, fill #0

## 2020-08-03 NOTE — Progress Notes (Signed)
   Covid-19 Vaccination Clinic  Name:  Clinton Garcia    MRN: [id number] DOB: [birth year]  08/03/2020  Mr. Belinsky was observed post Covid-19 immunization for 15 minutes without incident. He was provided with Vaccine Information Sheet and instruction to access the V-Safe system.   Mr. Pickeral was instructed to call 911 with any severe reactions post vaccine: Marland Kitchen Difficulty breathing  . Swelling of face and throat  . A fast heartbeat  . A bad rash all over body  . Dizziness and weakness   Immunizations Administered    Name Date Dose VIS Date Route   PFIZER Comrnaty(Gray TOP) Covid-19 Vaccine 08/03/2020  9:17 AM 0.3 mL 02/24/2020 Intramuscular   Manufacturer: [employer], [employer]   Lot: [id number]   NDC: [id number]

## 2020-12-04 ENCOUNTER — Ambulatory Visit: Payer: BC Managed Care – PPO | Attending: Internal Medicine

## 2020-12-04 ENCOUNTER — Other Ambulatory Visit (HOSPITAL_BASED_OUTPATIENT_CLINIC_OR_DEPARTMENT_OTHER): Payer: Self-pay

## 2020-12-04 ENCOUNTER — Other Ambulatory Visit: Payer: Self-pay

## 2020-12-04 DIAGNOSIS — Z23 Encounter for immunization: Secondary | ICD-10-CM

## 2020-12-04 MED ORDER — INFLUENZA VAC SPLIT QUAD 0.5 ML IM SUSY
PREFILLED_SYRINGE | INTRAMUSCULAR | 0 refills | Status: AC
  Filled 2020-12-04: qty 0.5, 1d supply, fill #0

## 2020-12-04 MED ORDER — COVID-19MRNA BIVAL VACC PFIZER 30 MCG/0.3ML IM SUSP
INTRAMUSCULAR | 0 refills | Status: AC
  Filled 2020-12-04: qty 0.3, 1d supply, fill #0

## 2020-12-04 NOTE — Progress Notes (Signed)
   Covid-19 Vaccination Clinic  Name:  Clinton Garcia    MRN: [id number] DOB: 1966/01/27  12/04/2020  Mr. [name] was observed post Covid-19 immunization for 15 minutes without incident. He was provided with Vaccine Information Sheet and instruction to access the V-Safe system.   Mr. [name] was instructed to call 911 with any severe reactions post vaccine: Difficulty breathing  Swelling of face and throat  A fast heartbeat  A bad rash all over body  Dizziness and weakness

## 2020-12-08 ENCOUNTER — Other Ambulatory Visit (HOSPITAL_BASED_OUTPATIENT_CLINIC_OR_DEPARTMENT_OTHER): Payer: Self-pay

## 2021-12-28 ENCOUNTER — Other Ambulatory Visit (HOSPITAL_BASED_OUTPATIENT_CLINIC_OR_DEPARTMENT_OTHER): Payer: Self-pay

## 2021-12-28 MED ORDER — COVID-19 MRNA 2023-2024 VACCINE (COMIRNATY) 0.3 ML INJECTION
INTRAMUSCULAR | 0 refills | Status: AC
  Filled 2021-12-28: qty 0.3, 1d supply, fill #0
